# Patient Record
Sex: Female | Born: 1960 | Race: White | Hispanic: No | Marital: Married | Smoking: Current every day smoker
Health system: Southern US, Community
[De-identification: ages and names within clinical notes are randomized; demographics above are authoritative.]

## PROBLEM LIST (undated history)

## (undated) DIAGNOSIS — I729 Aneurysm of unspecified site: Secondary | ICD-10-CM

## (undated) DIAGNOSIS — I10 Essential (primary) hypertension: Secondary | ICD-10-CM

## (undated) HISTORY — PX: OTHER SURGICAL HISTORY: SHX169

## (undated) HISTORY — DX: Essential (primary) hypertension: I10

## (undated) HISTORY — DX: Aneurysm of unspecified site: I72.9

## (undated) HISTORY — PX: PARTIAL HYSTERECTOMY: SHX80

---

## 2011-07-01 ENCOUNTER — Other Ambulatory Visit (HOSPITAL_COMMUNITY): Payer: Self-pay | Admitting: Family Medicine

## 2011-07-01 DIAGNOSIS — Z1231 Encounter for screening mammogram for malignant neoplasm of breast: Secondary | ICD-10-CM

## 2011-08-05 ENCOUNTER — Ambulatory Visit (HOSPITAL_COMMUNITY)
Admission: RE | Admit: 2011-08-05 | Discharge: 2011-08-05 | Disposition: A | Discharge status: Home / Self Care | Payer: Self-pay | Source: Ambulatory Visit | Attending: Family Medicine | Admitting: Family Medicine

## 2011-08-05 DIAGNOSIS — Z1231 Encounter for screening mammogram for malignant neoplasm of breast: Secondary | ICD-10-CM

## 2011-08-05 IMAGING — MG MM DIGITAL SCREENING BILAT W/ CAD
4 series · 4 of 4 positions shown · non-contrast
Comparison: none

DG SCHOLARSHIP MAMMOGRAM
Bilateral CC and MLO view(s) were taken.
Radiologist: Jaylin Fluker, M.D.
Technologist: Gansperger, Pirateg[place].(NEWSOME)(M)

BILATERAL DIGITAL SCREENING MAMMOGRAM WITH CAD

[R CC]
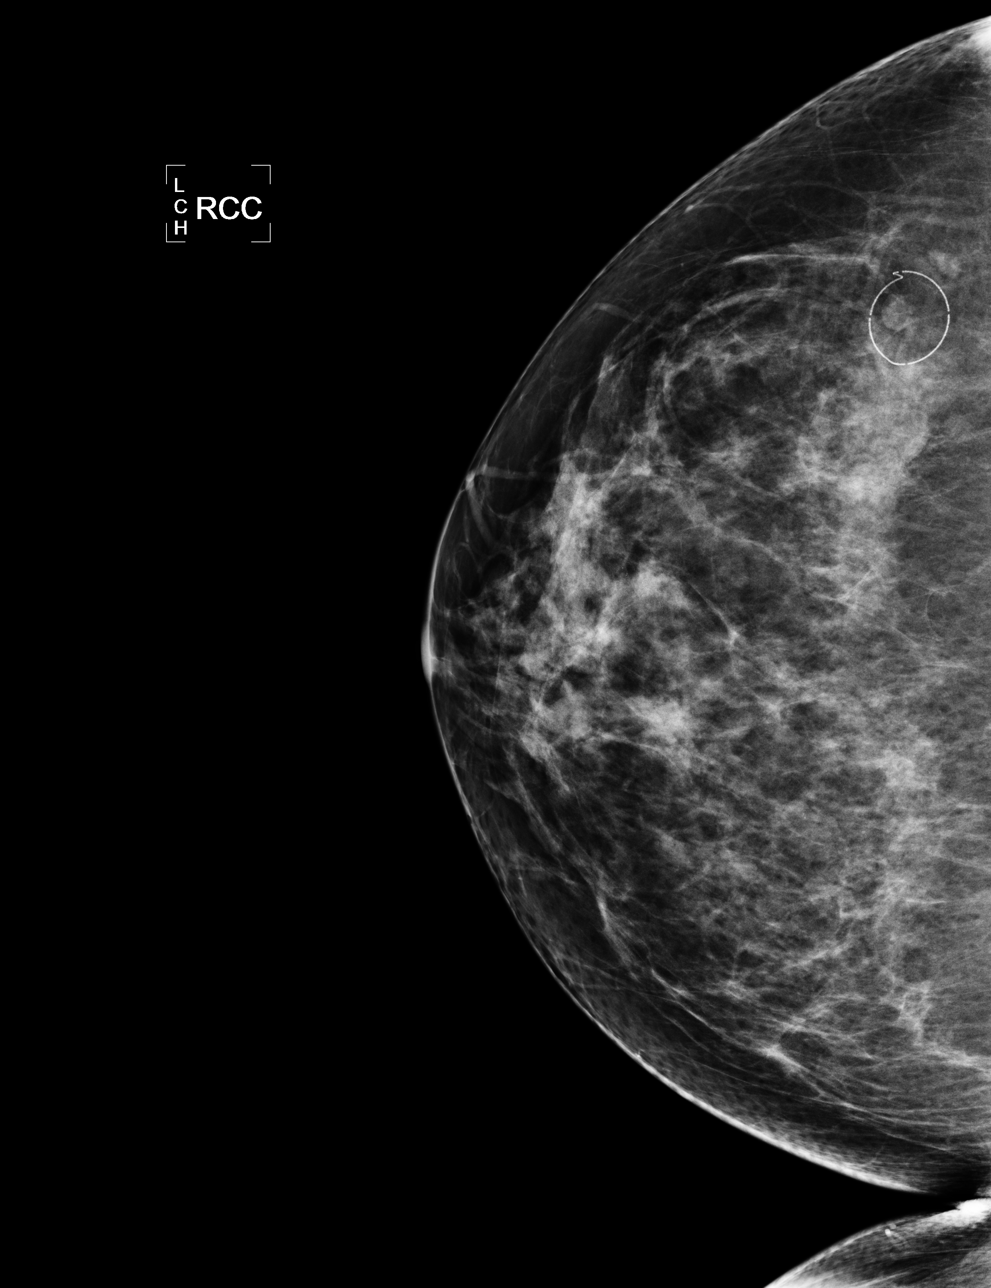

[R MLO]
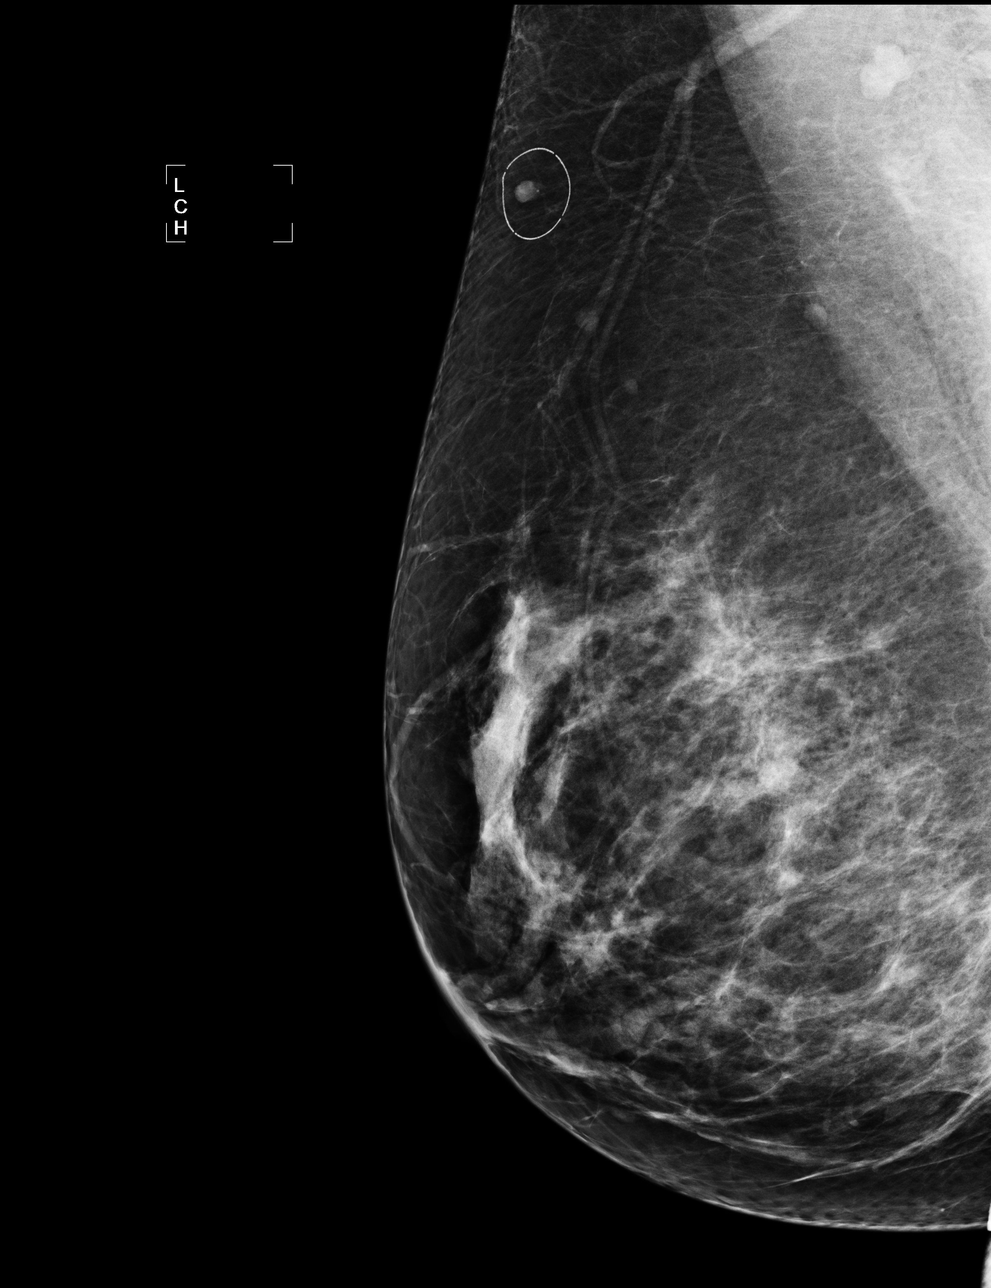

[L CC]
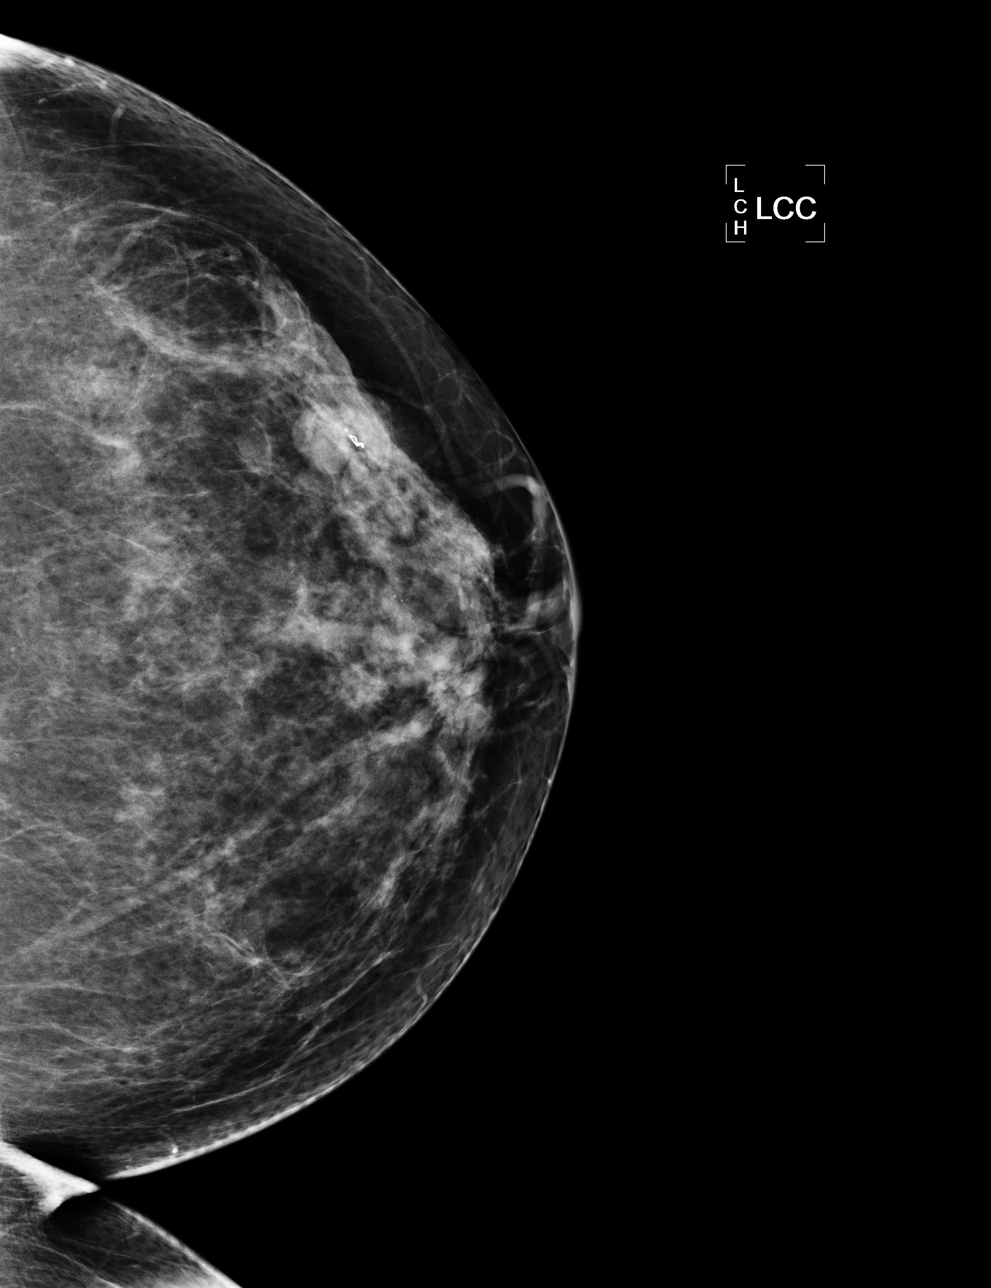

[L MLO]
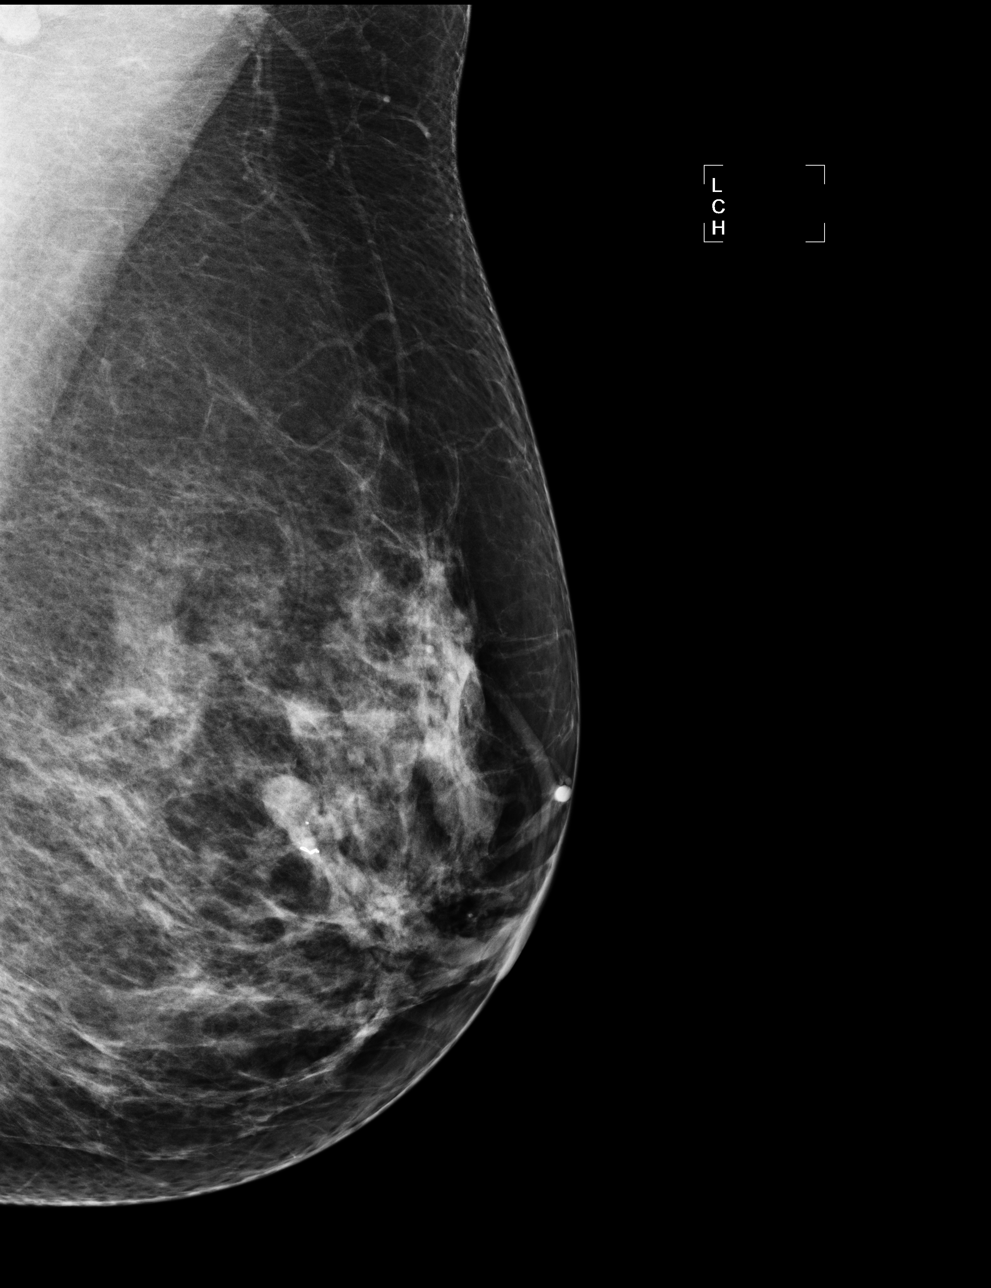

[4 of 4 positions shown; findings below may reference images not displayed]

FINDINGS: Prior films are needed for interpretation.
IMPRESSION: Prior exams will be requested for comparison.  Following comparison with prior studies an addendum 
will be made regarding further recommendations.
Images were processed with CAD.

ASSESSMENT: Need additional imaging evaluation and/or prior mammograms for comparison - BI-RADS 0

Further imaging of the right breast.
Addendum

DIGITAL SCREENING MAMMOGRAM WITH CAD:
There are scattered fibroglandular densities.  A possible mass is noted in the right breast.  Spot 
compression views and possibly sonography are recommended for further evaluation.  In the left 
breast, no masses or malignant type calcifications are identified.  Compared with prior studies.

Images were processed with CAD.
IMPRESSION: Possible mass, right breast.  Additional evaluation is indicated.  The patient will be contacted 
for additional studies and a supplementary report will follow.  No specific mammographic evidence 
of malignancy, left breast.

Addended by Dr. [place] on 08-08-11.
,

## 2011-08-13 ENCOUNTER — Other Ambulatory Visit: Payer: Self-pay | Admitting: Family Medicine

## 2011-08-13 DIAGNOSIS — R928 Other abnormal and inconclusive findings on diagnostic imaging of breast: Secondary | ICD-10-CM

## 2011-09-02 ENCOUNTER — Encounter (HOSPITAL_COMMUNITY): Payer: Self-pay

## 2011-09-02 ENCOUNTER — Ambulatory Visit (INDEPENDENT_AMBULATORY_CARE_PROVIDER_SITE_OTHER): Payer: Self-pay | Admitting: *Deleted

## 2011-09-02 VITALS — BP 133/78 | HR 82 | Temp 97.0°F | Ht 60.0 in | Wt 129.0 lb

## 2011-09-02 DIAGNOSIS — Z1239 Encounter for other screening for malignant neoplasm of breast: Secondary | ICD-10-CM

## 2011-09-02 NOTE — Patient Instructions (Signed)
Taught patient how to perform BSE and gave educational materials to take home. Patient did not need a Pap smear today due to history of a hysterectomy for benign reasons. Informed patient that she will not need any further Pap smears due to having a hysterectomy for benign reasons and no history of abnormal Pap smears. Patient is scheduled for a right breast diagnostic mammogram next Tuesday, September 08, 2029 at 1410. Patient aware of appointment and will be there. Let patient know will follow up with her within the next couple weeks with results. Patient verbalized understanding.

## 2011-09-02 NOTE — Progress Notes (Signed)
Patient referred to [facility] due to needing additional imaging of the right breast.  Pap Smear:    Pap smear not performed today. Patients last Pap smear was in 2010  and was normal per patient. Patient has a history of a partial hysterectomy with only one ovary remaining due to heavy periods. Per patient no history of abnormal Pap smears. No Pap smear results in EPIC.  Physical exam: Breasts Breasts symmetrical. No skin abnormalities bilateral breasts. No nipple retraction bilateral breasts. No nipple discharge bilateral breasts. No lymphadenopathy. Bilateral breasts are lumpy.  No complaints of pain or tenderness on palpation.  [facility] referred to [facility] for right Diagnostic Mammogram for area of concern on mammogram and possible right breast ultrasound Tuesday, September 09, 2011 at 1410.         Pelvic/Bimanual No Pap smear completed today since patient has a history of a hysterectomy in 2010 for benign reasons and no history of abnormal Pap smears. Pap smear not indicated per [facility] guidelines.

## 2011-09-04 ENCOUNTER — Other Ambulatory Visit: Payer: Self-pay

## 2011-09-09 ENCOUNTER — Other Ambulatory Visit: Payer: Self-pay

## 2011-09-11 ENCOUNTER — Ambulatory Visit
Admission: RE | Admit: 2011-09-11 | Discharge: 2011-09-11 | Disposition: A | Discharge status: Home / Self Care | Payer: No Typology Code available for payment source | Source: Ambulatory Visit | Attending: Family Medicine | Admitting: Family Medicine

## 2011-09-11 DIAGNOSIS — R928 Other abnormal and inconclusive findings on diagnostic imaging of breast: Secondary | ICD-10-CM

## 2011-09-11 IMAGING — MG MM DIGITAL DIAGNOSTIC LIMITED*R*
2 series · 2 of 2 positions shown · non-contrast
Comparison: 08/05/2011, 05/18/2009, dating back to 02/19/2004.

CLINICAL DATA: Called back from screening mammography, possible
right breast mass, visualized only in the MLO view.

DIGITAL DIAGNOSTIC RIGHT MAMMOGRAM WITH CAD AND RIGHT BREAST
ULTRASOUND:

[R MLO]
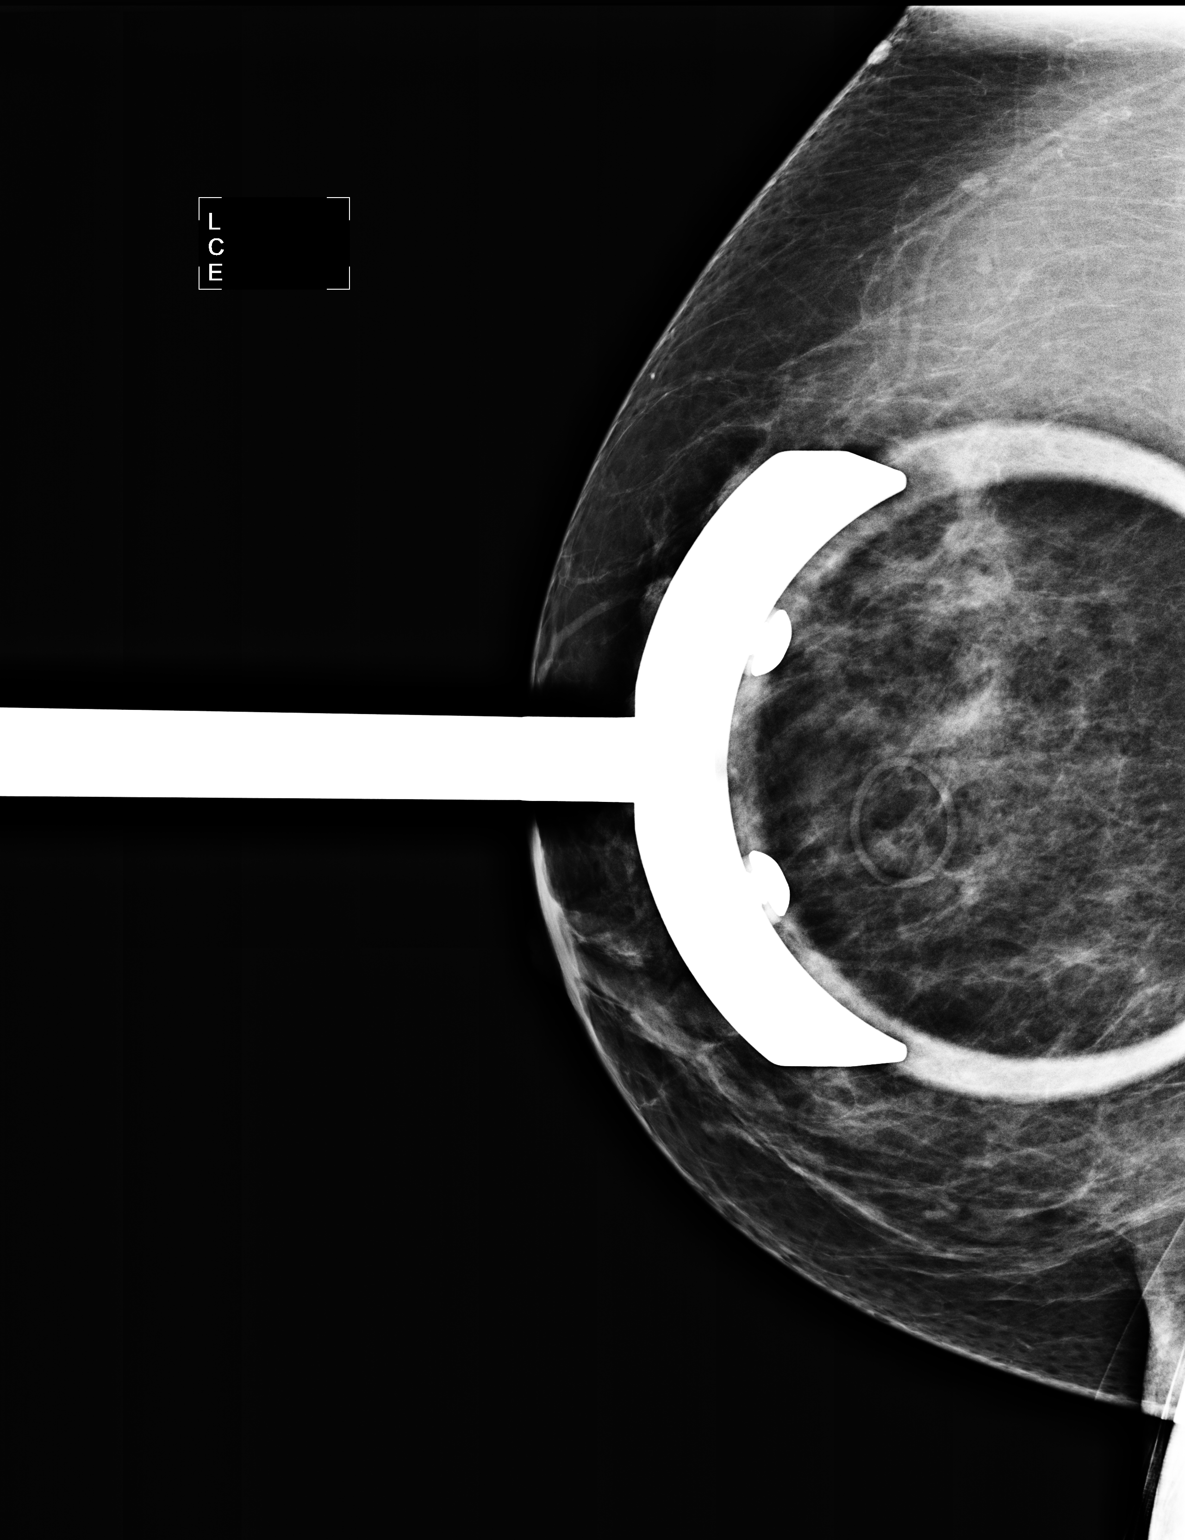

[R ML]
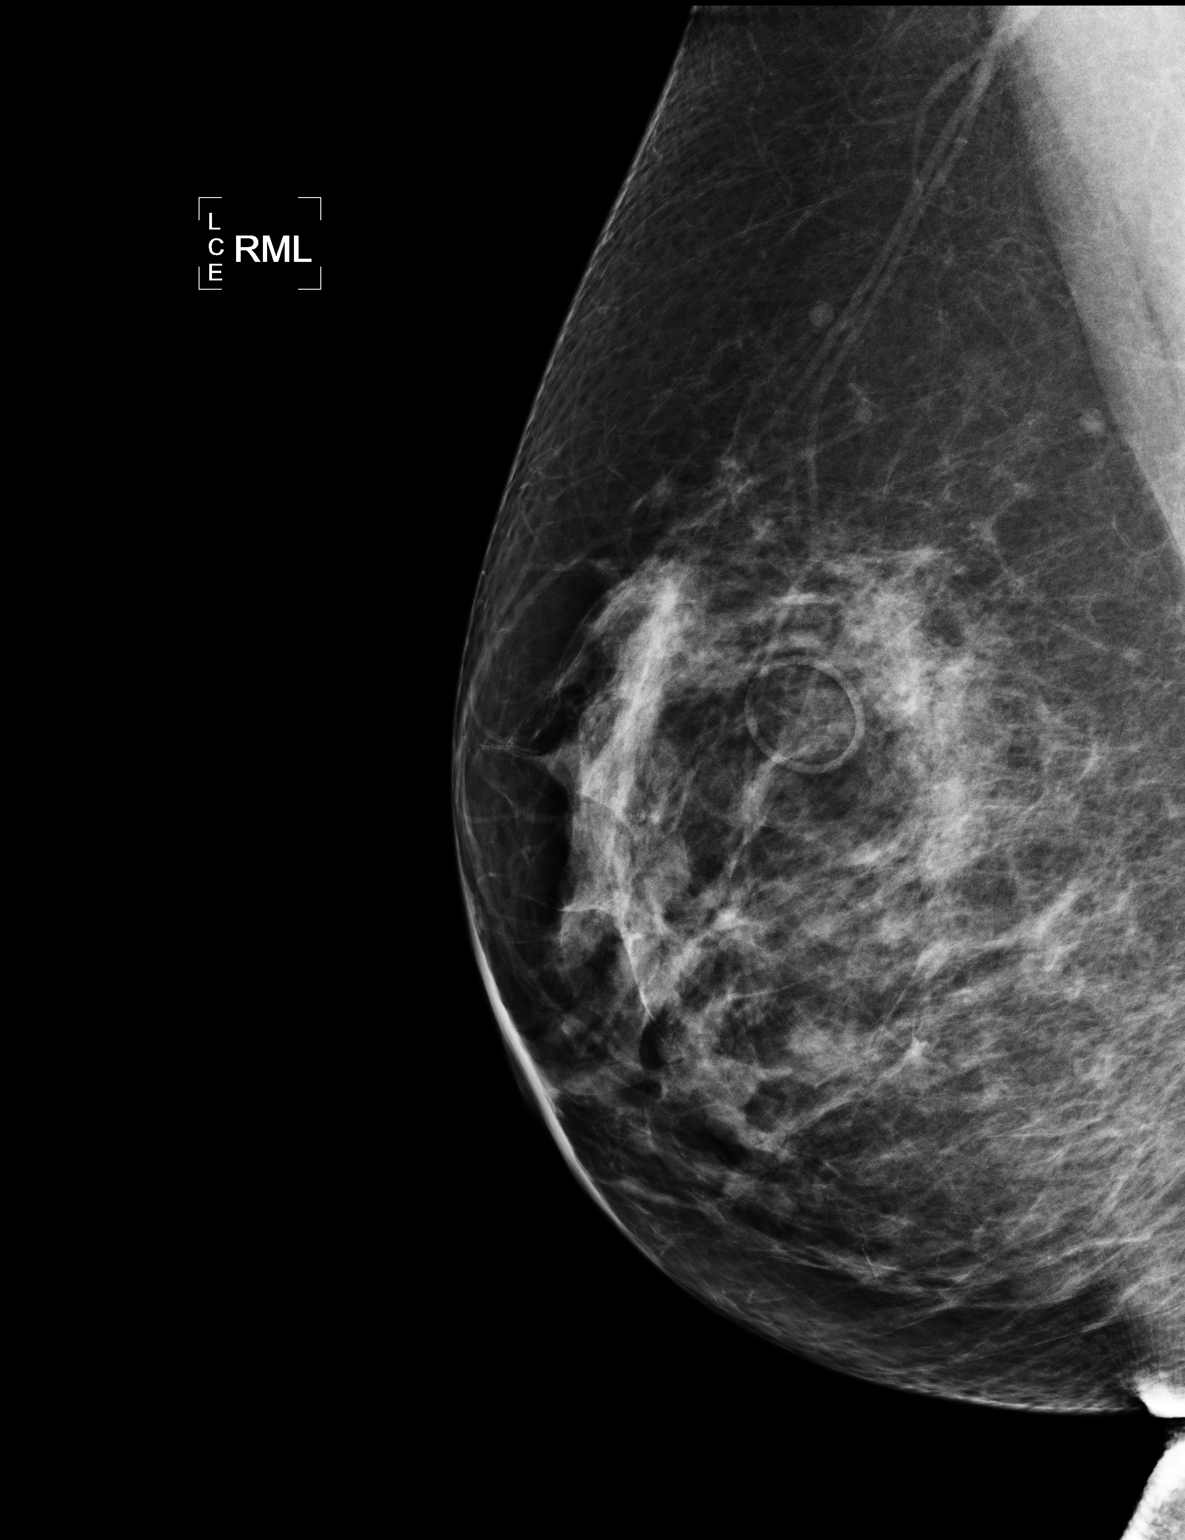

[2 of 2 positions shown; findings below may reference images not displayed]

FINDINGS: Spot compression MLO view and true lateral view of the
right breast were obtained.  Scattered fibroglandular parenchymal
pattern.  No suspicious mass, nonsurgical architectural distortion,
or suspicious calcifications in the right breast.
Mammographic images were processed with CAD.

On physical exam, I palpate no mass in the right breast.

Ultrasound is performed, showing normal fatty and fibroglandular
tissue throughout the right breast.  No cyst, solid mass, or
abnormal acoustic shadowing was identified.
IMPRESSION: No mammographic or sonographic evidence of malignancy, right
breast.

Recommendation:  Annual bilateral screening mammography in 1 year,
July 2012.

BI-RADS CATEGORY 1:  Negative.

## 2011-09-18 ENCOUNTER — Telehealth: Payer: Self-pay | Admitting: *Deleted

## 2011-09-18 NOTE — Telephone Encounter (Signed)
Attempted to call patient to discuss [facility] clinic results. No one answered phone. Left voicemail for patient to call me back.

## 2011-09-18 NOTE — Telephone Encounter (Signed)
Patient called me back. Patient has received results [facility]. She is aware she will need a screening mammogram in 1 year January 2014. Told patient she can call us to schedule her screening mammogram if still eligible for [facility]. Patient verbalized understanding

## 2013-01-05 ENCOUNTER — Other Ambulatory Visit (HOSPITAL_COMMUNITY): Payer: Self-pay | Admitting: Family Medicine

## 2013-01-05 DIAGNOSIS — Z1231 Encounter for screening mammogram for malignant neoplasm of breast: Secondary | ICD-10-CM

## 2013-01-13 ENCOUNTER — Ambulatory Visit (HOSPITAL_COMMUNITY)
Admission: RE | Admit: 2013-01-13 | Discharge: 2013-01-13 | Disposition: A | Discharge status: Home / Self Care | Payer: BC Managed Care – PPO | Source: Ambulatory Visit | Attending: Family Medicine | Admitting: Family Medicine

## 2013-01-13 DIAGNOSIS — Z1231 Encounter for screening mammogram for malignant neoplasm of breast: Secondary | ICD-10-CM | POA: Insufficient documentation

## 2013-01-13 IMAGING — MG MM DIGITAL SCREENING BILAT W/ CAD
4 series · 4 of 4 positions shown · non-contrast
Comparison: Previous exams.

CLINICAL DATA: Screening.

DIGITAL SCREENING BILATERAL MAMMOGRAM WITH CAD

[R CC]
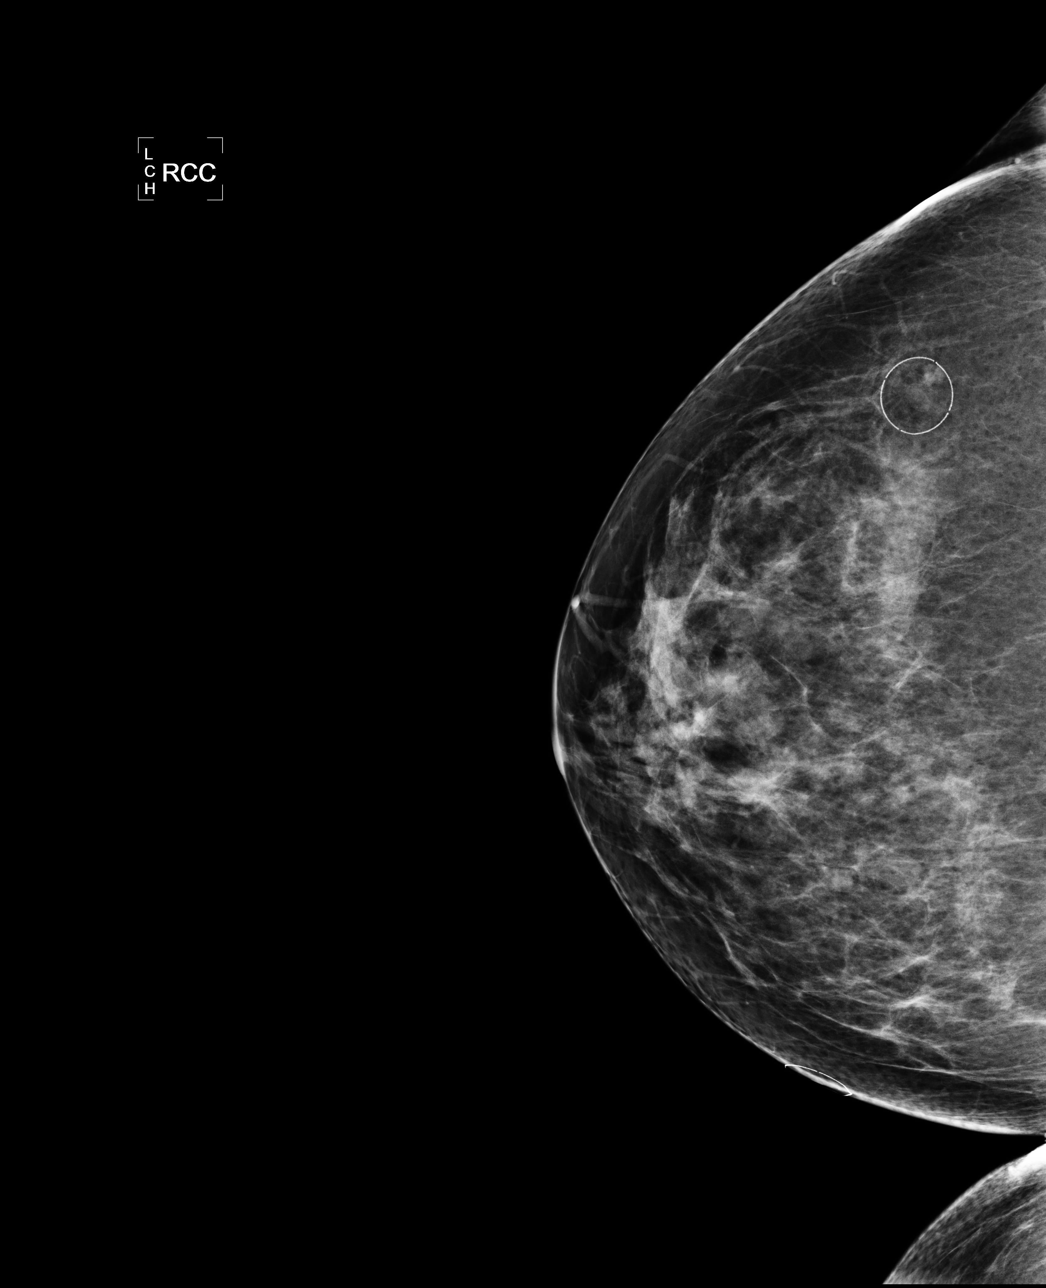

[R MLO]
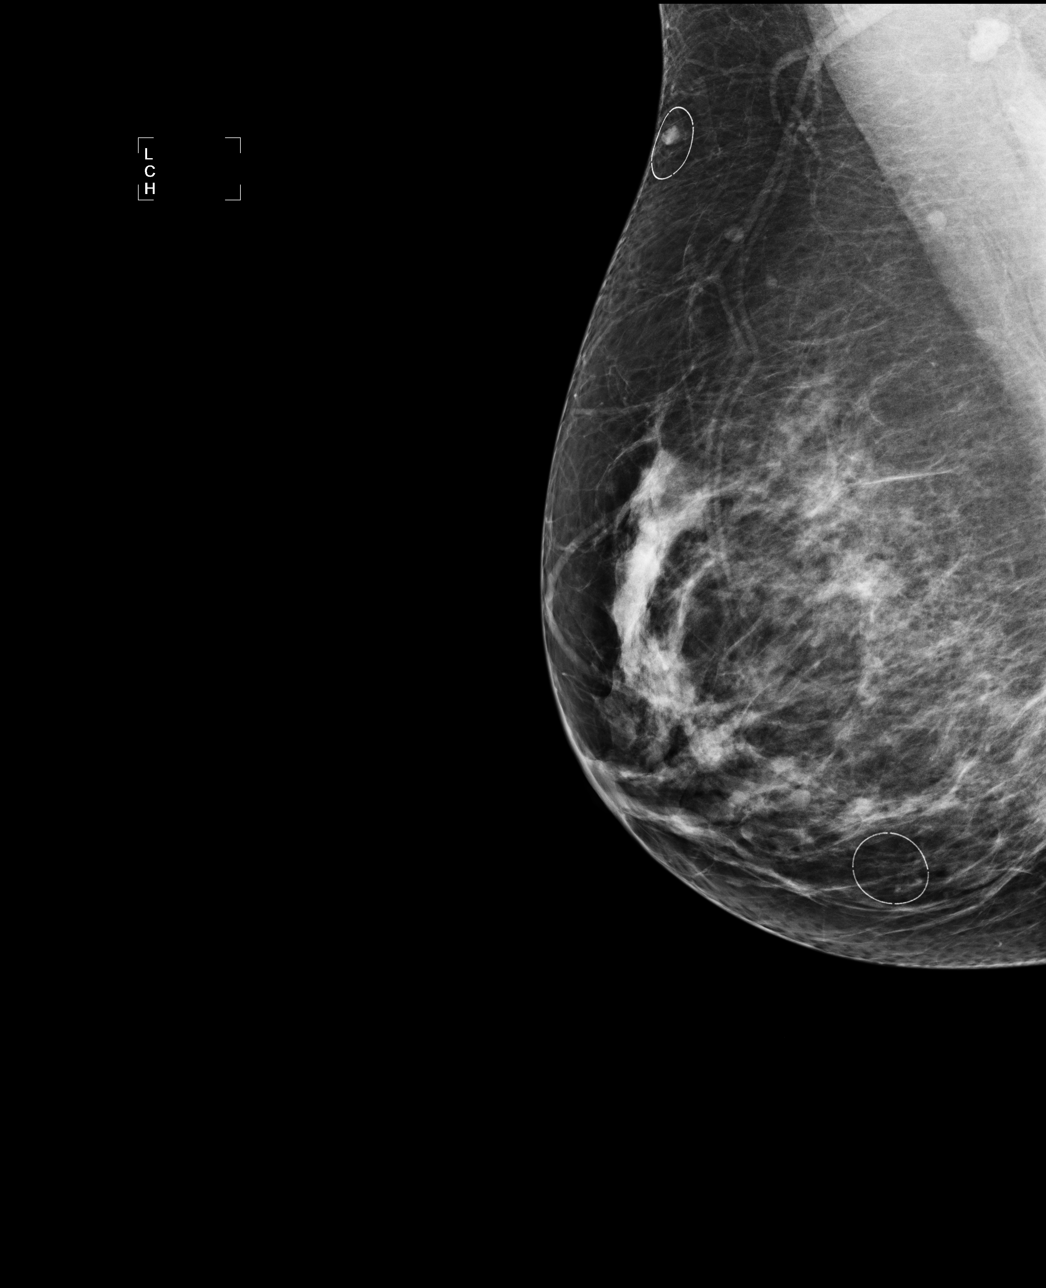

[L CC]
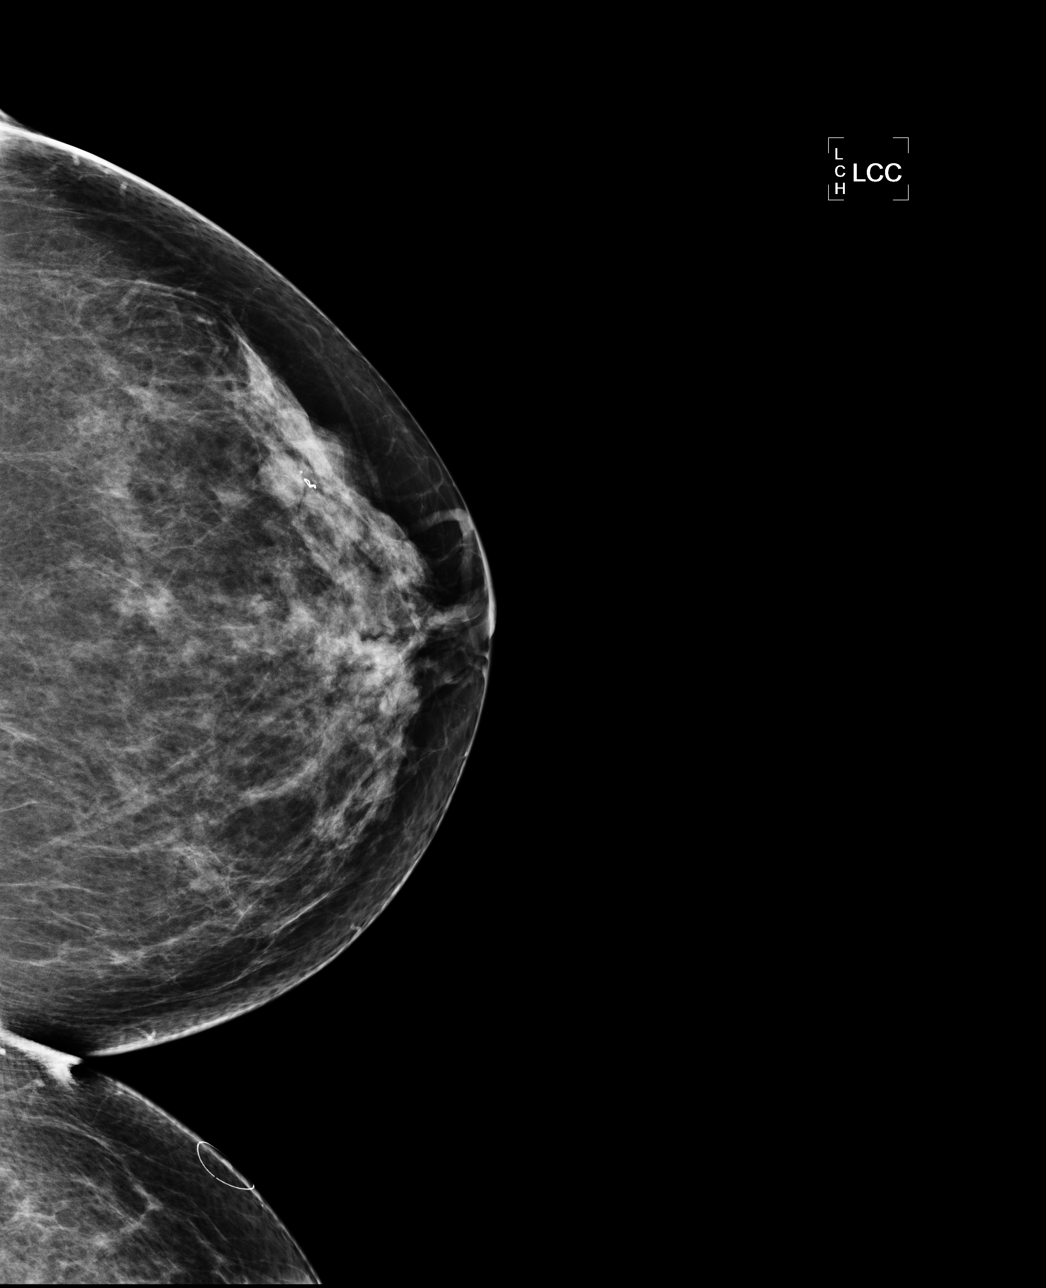

[L MLO]
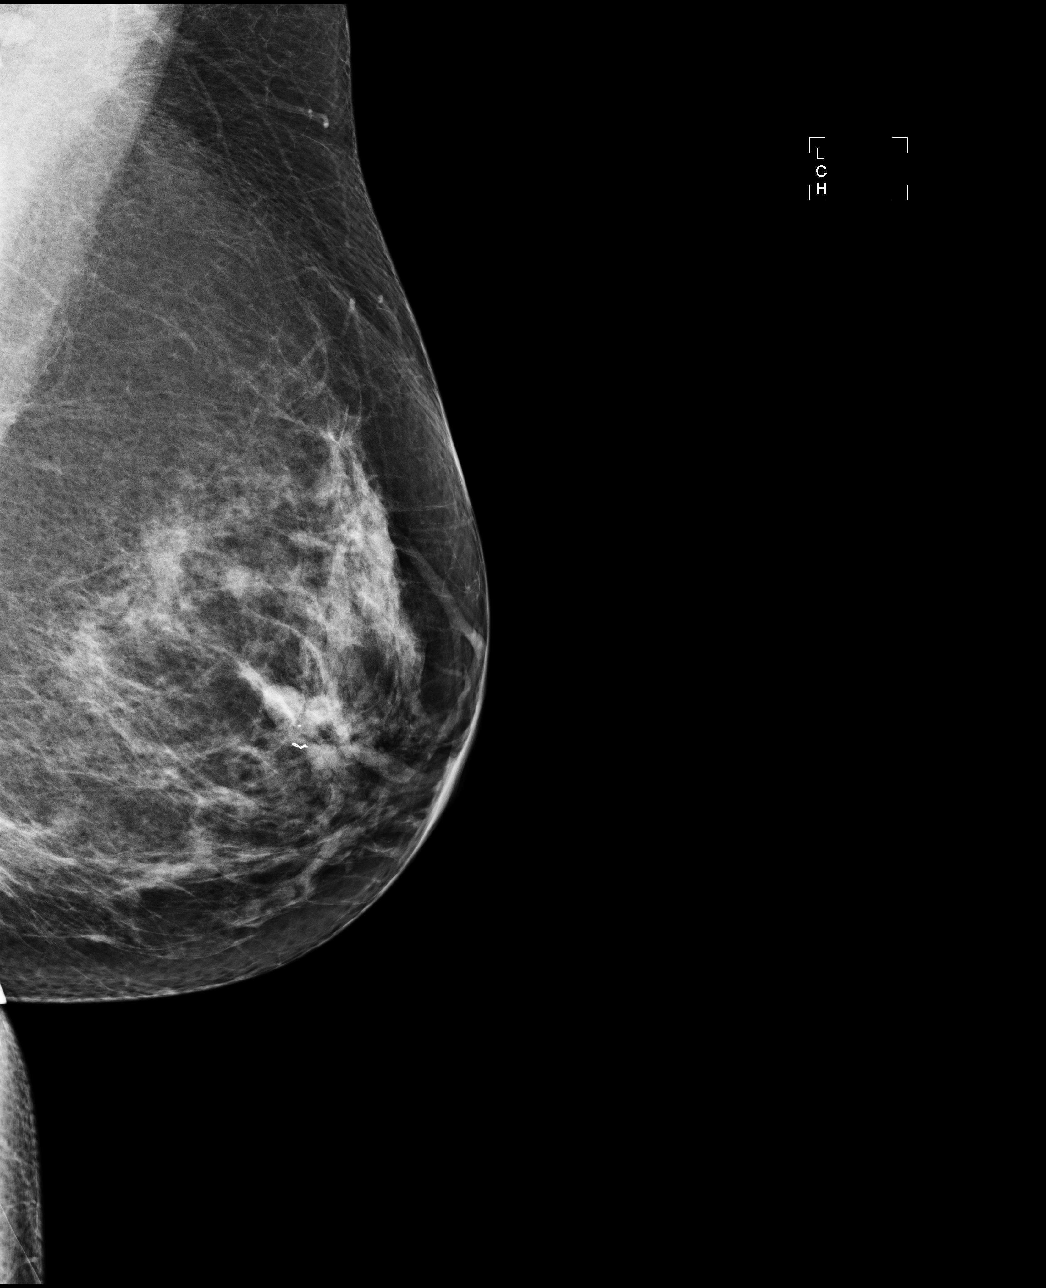

[4 of 4 positions shown; findings below may reference images not displayed]

FINDINGS: ACR Breast Density Category c: The breast tissue is heterogeneously
dense.

There are no findings suspicious for malignancy.

Images were processed with CAD.
IMPRESSION: No mammographic evidence of malignancy.

A result letter of this screening mammogram will be mailed directly
to the patient.

RECOMMENDATION:
Screening mammogram in one year. (Code:[id number])

BI-RADS CATEGORY 2:  Benign finding(s).

## 2014-11-13 ENCOUNTER — Other Ambulatory Visit (HOSPITAL_COMMUNITY): Payer: Self-pay | Admitting: Family Medicine

## 2014-11-13 DIAGNOSIS — R937 Abnormal findings on diagnostic imaging of other parts of musculoskeletal system: Secondary | ICD-10-CM

## 2014-11-16 ENCOUNTER — Other Ambulatory Visit (HOSPITAL_COMMUNITY): Payer: Self-pay | Admitting: Family Medicine

## 2014-11-16 DIAGNOSIS — Z1231 Encounter for screening mammogram for malignant neoplasm of breast: Secondary | ICD-10-CM

## 2014-12-08 ENCOUNTER — Ambulatory Visit (HOSPITAL_COMMUNITY)
Admission: RE | Admit: 2014-12-08 | Discharge: 2014-12-08 | Disposition: A | Discharge status: Home / Self Care | Payer: Managed Care, Other (non HMO) | Source: Ambulatory Visit | Attending: Family Medicine | Admitting: Family Medicine

## 2014-12-08 DIAGNOSIS — Z13820 Encounter for screening for osteoporosis: Secondary | ICD-10-CM | POA: Diagnosis present

## 2014-12-08 DIAGNOSIS — R937 Abnormal findings on diagnostic imaging of other parts of musculoskeletal system: Secondary | ICD-10-CM

## 2014-12-08 DIAGNOSIS — M8580 Other specified disorders of bone density and structure, unspecified site: Secondary | ICD-10-CM | POA: Insufficient documentation

## 2014-12-08 DIAGNOSIS — Z1231 Encounter for screening mammogram for malignant neoplasm of breast: Secondary | ICD-10-CM

## 2014-12-08 IMAGING — MG MM DIGITAL SCREENING BILAT W/ CAD
4 series · 4 of 4 positions shown · non-contrast
Comparison: Previous exam(s).

CLINICAL DATA: Screening.

EXAM:
DIGITAL SCREENING BILATERAL MAMMOGRAM WITH CAD

[R MLO]
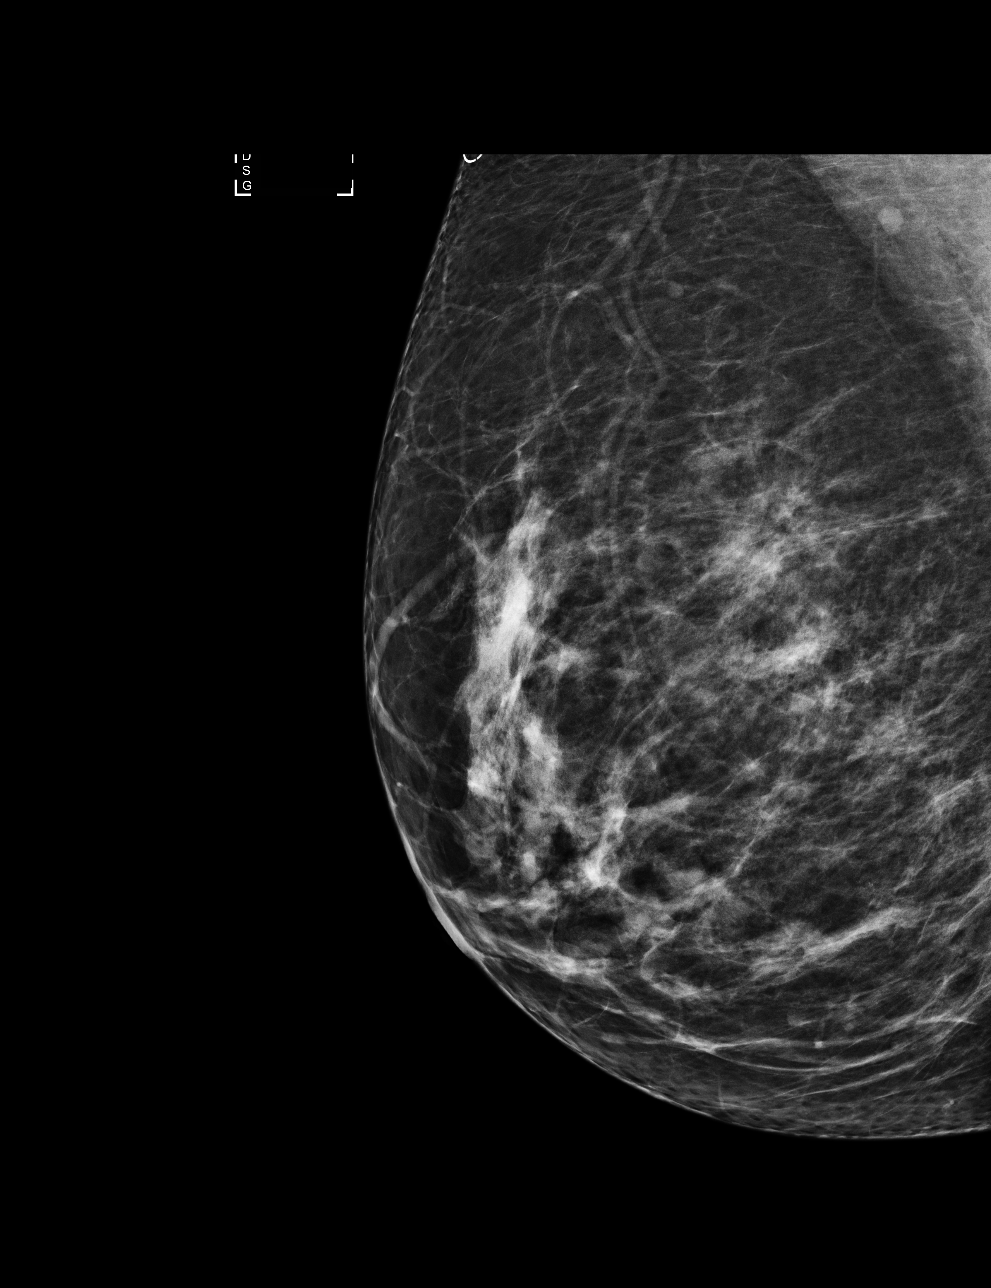

[L MLO]
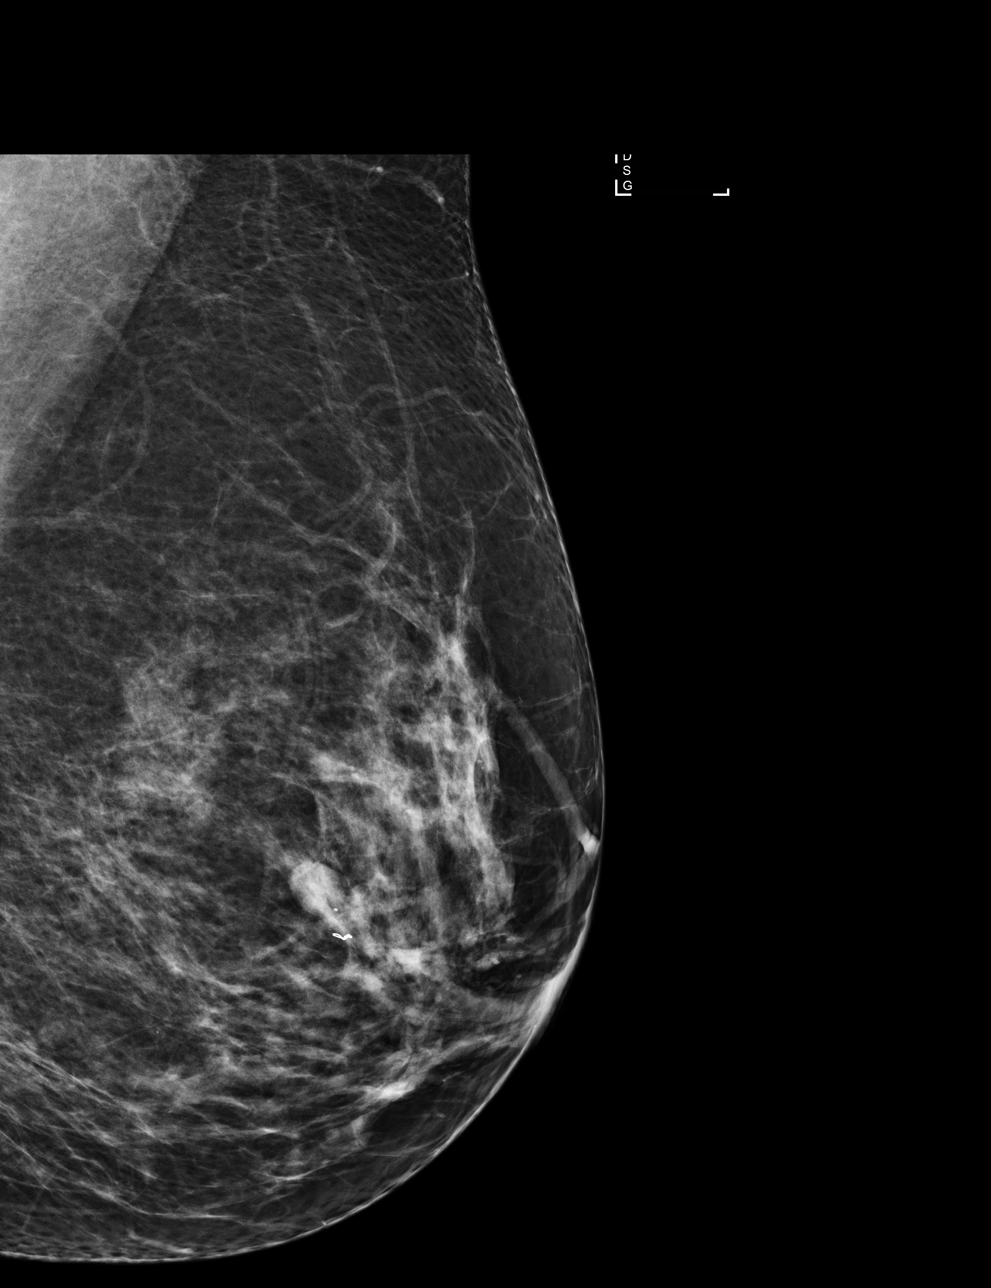

[R CC]
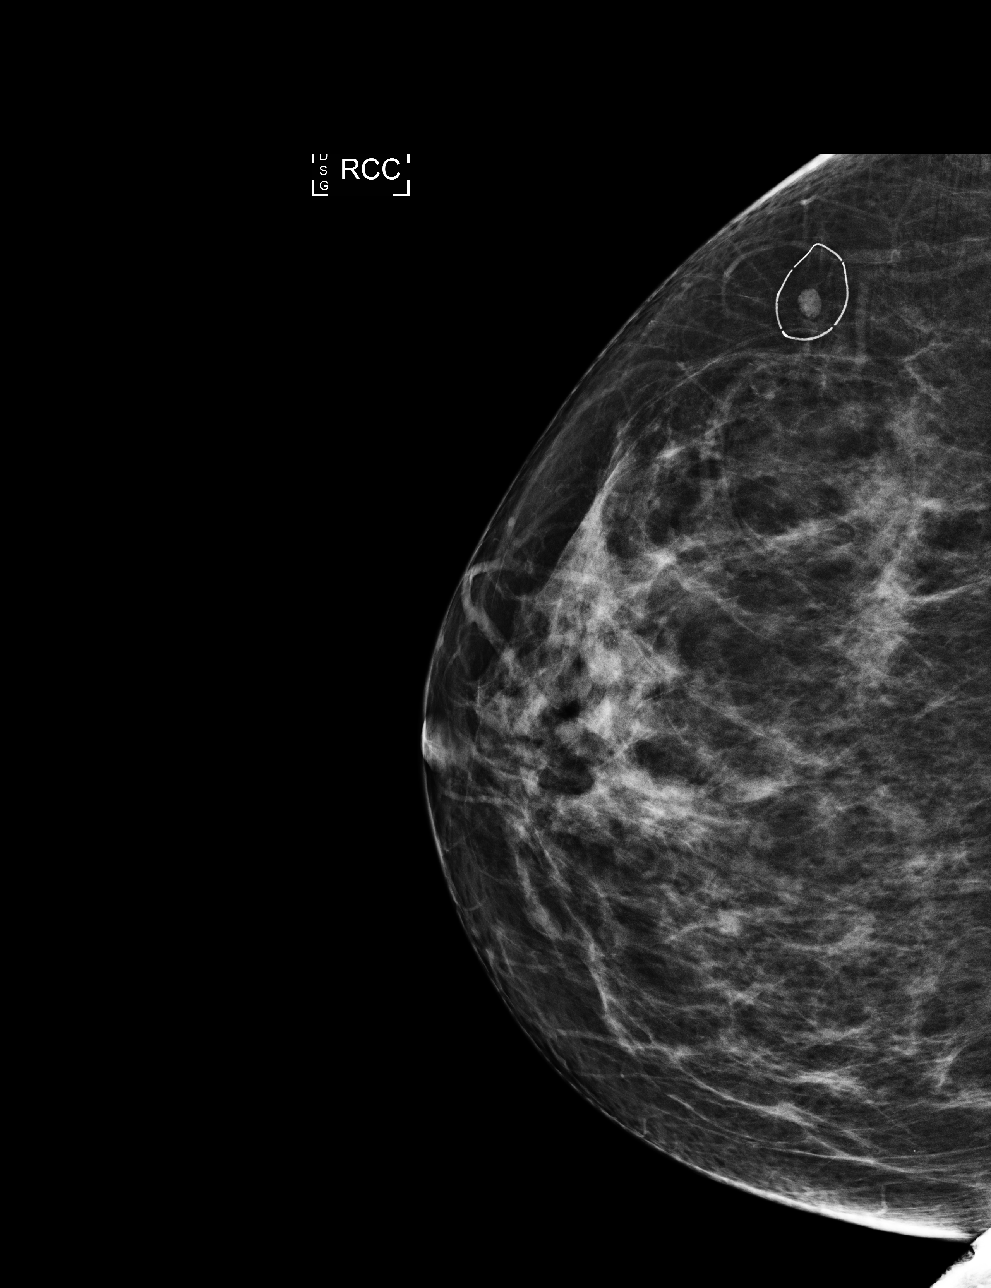

[L CC]
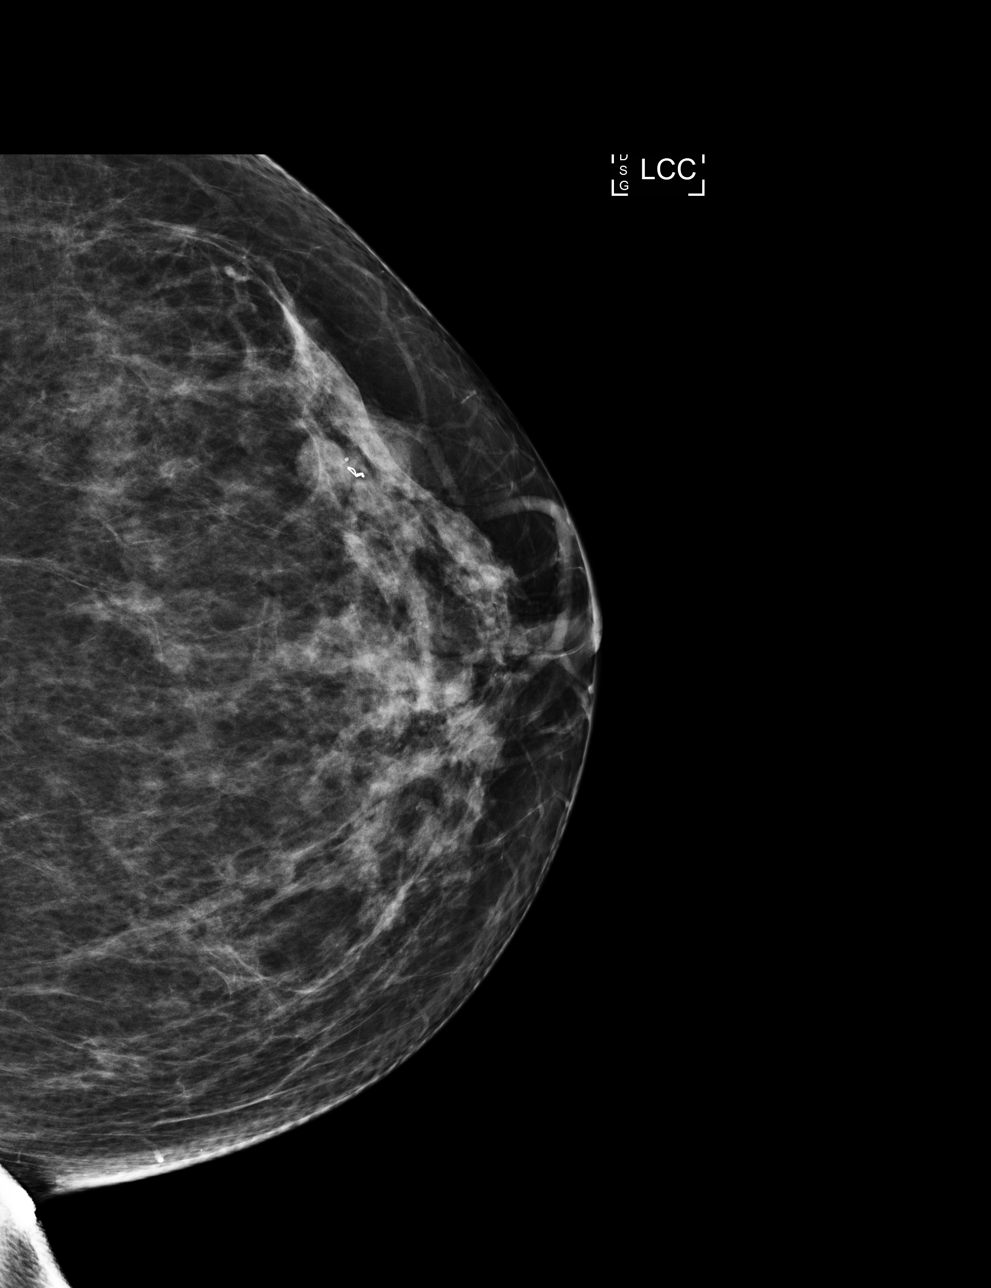

[4 of 4 positions shown; findings below may reference images not displayed]

ACR Breast Density Category c: The breast tissue is heterogeneously
dense, which may obscure small masses.
FINDINGS: There are no findings suspicious for malignancy. Images were
processed with CAD.
IMPRESSION: No mammographic evidence of malignancy. A result letter of this
screening mammogram will be mailed directly to the patient.

RECOMMENDATION:
Screening mammogram in one year. (Code:[id number])

BI-RADS CATEGORY  1: Negative.

## 2020-07-11 ENCOUNTER — Telehealth: Payer: Self-pay | Admitting: Nurse Practitioner

## 2020-07-11 NOTE — Telephone Encounter (Signed)
Patient contacted [facility] as self-referral for positive COVID-19 test.  Positive Test: 12/9  Due to increased patient volumes and limitations of supply for mAb, we are unfortunately unable to schedule this patient at this time. Recommended the patient contact her PCP for information on referral process with [facility] as her PCP is within their service area.
